# Patient Record
Sex: Male | Born: 1990 | Race: White | Hispanic: No | Smoking: Never smoker
Health system: Southern US, Community
[De-identification: ages and names within clinical notes are randomized; demographics above are authoritative.]

---

## 2014-06-02 ENCOUNTER — Encounter (INDEPENDENT_AMBULATORY_CARE_PROVIDER_SITE_OTHER): Payer: BLUE CROSS/BLUE SHIELD | Admitting: Family Medicine

## 2014-06-02 ENCOUNTER — Encounter: Payer: Self-pay | Admitting: Family Medicine

## 2014-06-17 NOTE — Progress Notes (Signed)
This encounter was created in error - please disregard.

## 2014-06-17 NOTE — Progress Notes (Deleted)
   Subjective:    Patient ID: Jeffrey RighterJonathan Bokhari, male    DOB: 04-16-1990, 24 y.o.   MRN: [id number] This chart was scribed for Norberto SorensonEva Shaw, MD by Littie Deedsichard Sun, Medical Scribe. This patient was seen in Room 28 and the patient's care was started at 11:46 PM.   Diarrhea    HPI Comments: Jeffrey Russell is a 24 y.o. male who presents to the [facility] complaining of ***    Review of Systems  Gastrointestinal: Positive for diarrhea.       Objective:  BP 125/80 mmHg  Pulse 102  Temp(Src) 99.1 F (37.3 C) (Oral)  Resp 16  Ht 5\' 11"  (1.803 m)  Wt 134 lb 9.6 oz (61.054 kg)  BMI 18.78 kg/m2  SpO2 100%  Physical Exam  Constitutional: He is oriented to person, place, and time. He appears well-developed and well-nourished. No distress.  HENT:  Head: Normocephalic and atraumatic.  Mouth/Throat: Oropharynx is clear and moist. No oropharyngeal exudate.  Eyes: Pupils are equal, round, and reactive to light.  Neck: Neck supple.  Cardiovascular: Normal rate.   Pulmonary/Chest: Effort normal.  Musculoskeletal: He exhibits no edema.  Neurological: He is alert and oriented to person, place, and time. No cranial nerve deficit.  Skin: Skin is warm and dry. No rash noted.  Psychiatric: He has a normal mood and affect. His behavior is normal.  Nursing note and vitals reviewed.         Assessment & Plan:    *** {Add scribe attestation statement}

## 2014-06-23 ENCOUNTER — Ambulatory Visit (INDEPENDENT_AMBULATORY_CARE_PROVIDER_SITE_OTHER): Payer: BLUE CROSS/BLUE SHIELD

## 2014-06-23 ENCOUNTER — Ambulatory Visit (INDEPENDENT_AMBULATORY_CARE_PROVIDER_SITE_OTHER): Payer: BLUE CROSS/BLUE SHIELD | Admitting: Family Medicine

## 2014-06-23 ENCOUNTER — Encounter: Payer: Self-pay | Admitting: Family Medicine

## 2014-06-23 VITALS — BP 100/64 | HR 81 | Temp 98.6°F | Resp 16 | Ht 71.5 in | Wt 135.2 lb

## 2014-06-23 DIAGNOSIS — K219 Gastro-esophageal reflux disease without esophagitis: Secondary | ICD-10-CM | POA: Diagnosis not present

## 2014-06-23 DIAGNOSIS — H6981 Other specified disorders of Eustachian tube, right ear: Secondary | ICD-10-CM | POA: Diagnosis not present

## 2014-06-23 DIAGNOSIS — R059 Cough, unspecified: Secondary | ICD-10-CM

## 2014-06-23 DIAGNOSIS — B3781 Candidal esophagitis: Secondary | ICD-10-CM | POA: Diagnosis not present

## 2014-06-23 DIAGNOSIS — Z0000 Encounter for general adult medical examination without abnormal findings: Secondary | ICD-10-CM | POA: Diagnosis not present

## 2014-06-23 DIAGNOSIS — Z113 Encounter for screening for infections with a predominantly sexual mode of transmission: Secondary | ICD-10-CM

## 2014-06-23 DIAGNOSIS — B370 Candidal stomatitis: Secondary | ICD-10-CM | POA: Diagnosis not present

## 2014-06-23 DIAGNOSIS — R05 Cough: Secondary | ICD-10-CM | POA: Diagnosis not present

## 2014-06-23 DIAGNOSIS — R410 Disorientation, unspecified: Secondary | ICD-10-CM | POA: Diagnosis not present

## 2014-06-23 DIAGNOSIS — E559 Vitamin D deficiency, unspecified: Secondary | ICD-10-CM

## 2014-06-23 LAB — POCT URINALYSIS DIPSTICK
BILIRUBIN UA: NEGATIVE
Blood, UA: NEGATIVE
Glucose, UA: NEGATIVE
KETONES UA: NEGATIVE
Leukocytes, UA: NEGATIVE
Nitrite, UA: NEGATIVE
PH UA: 7
PROTEIN UA: NEGATIVE
SPEC GRAV UA: 1.015
Urobilinogen, UA: 0.2

## 2014-06-23 LAB — POCT UA - MICROSCOPIC ONLY
CASTS, UR, LPF, POC: NEGATIVE
Crystals, Ur, HPF, POC: NEGATIVE
Mucus, UA: POSITIVE
WBC, UR, HPF, POC: NEGATIVE
YEAST UA: NEGATIVE

## 2014-06-23 LAB — COMPLETE METABOLIC PANEL WITH GFR
ALBUMIN: 4.9 g/dL (ref 3.5–5.2)
ALT: 13 U/L (ref 0–53)
AST: 18 U/L (ref 0–37)
Alkaline Phosphatase: 49 U/L (ref 39–117)
BUN: 14 mg/dL (ref 6–23)
CALCIUM: 9.8 mg/dL (ref 8.4–10.5)
CO2: 27 mEq/L (ref 19–32)
CREATININE: 0.86 mg/dL (ref 0.50–1.35)
Chloride: 100 mEq/L (ref 96–112)
Glucose, Bld: 87 mg/dL (ref 70–99)
POTASSIUM: 4 meq/L (ref 3.5–5.3)
Sodium: 139 mEq/L (ref 135–145)
Total Bilirubin: 0.7 mg/dL (ref 0.2–1.2)
Total Protein: 7.7 g/dL (ref 6.0–8.3)

## 2014-06-23 LAB — TSH: TSH: 1.214 u[IU]/mL (ref 0.350–4.500)

## 2014-06-23 LAB — LIPID PANEL
CHOL/HDL RATIO: 2.9 ratio
Cholesterol: 140 mg/dL (ref 0–200)
HDL: 49 mg/dL (ref >=40)
LDL Cholesterol: 82 mg/dL (ref 0–99)
TRIGLYCERIDES: 45 mg/dL (ref <150)
VLDL: 9 mg/dL (ref 0–40)

## 2014-06-23 LAB — C-REACTIVE PROTEIN: CRP: <0.5 mg/dL (ref <0.60)

## 2014-06-23 LAB — VITAMIN B12: Vitamin B-12: 1001 pg/mL — ABNORMAL HIGH (ref 211–911)

## 2014-06-23 MED ORDER — FLUTICASONE PROPIONATE 50 MCG/ACT NA SUSP: 2.0000 | Freq: Every day | NASAL | Status: DC

## 2014-06-23 MED ORDER — CETIRIZINE HCL 10 MG PO TABS: 10.0000 mg | ORAL_TABLET | Freq: Every day | ORAL | Status: DC

## 2014-06-23 MED ORDER — PSEUDOEPHEDRINE HCL ER 120 MG PO TB12: 120.0000 mg | ORAL_TABLET | Freq: Every day | ORAL | Status: DC

## 2014-06-23 MED ORDER — ALBUTEROL SULFATE (2.5 MG/3ML) 0.083% IN NEBU
2.5000 mg | INHALATION_SOLUTION | Freq: Once | RESPIRATORY_TRACT | Status: AC
  Administered 2014-06-23: 2.5 mg via RESPIRATORY_TRACT

## 2014-06-23 MED ORDER — FLUCONAZOLE 100 MG PO TABS: 100.0000 mg | ORAL_TABLET | Freq: Every day | ORAL | Status: DC

## 2014-06-23 MED ORDER — RANITIDINE HCL 150 MG PO CAPS: 150.0000 mg | ORAL_CAPSULE | Freq: Two times a day (BID) | ORAL | Status: DC

## 2014-06-23 NOTE — Patient Instructions (Addendum)
Attention Deficit Hyperactivity Disorder Attention deficit hyperactivity disorder (ADHD) is a problem with behavior issues based on the way the brain functions (neurobehavioral disorder). It is a common reason for behavior and academic problems in school. SYMPTOMS  There are 3 types of ADHD. The 3 types and some of the symptoms include:  Inattentive.  Gets bored or distracted easily.  Loses or forgets things. Forgets to hand in homework.  Has trouble organizing or completing tasks.  Difficulty staying on task.  An inability to organize daily tasks and school work.  Leaving projects, chores, or homework unfinished.  Trouble paying attention or responding to details. Careless mistakes.  Difficulty following directions. Often seems like is not listening.  Dislikes activities that require sustained attention (like chores or homework).  Hyperactive-impulsive.  Feels like it is impossible to sit still or stay in a seat. Fidgeting with hands and feet.  Trouble waiting turn.  Talking too much or out of turn. Interruptive.  Speaks or acts impulsively.  Aggressive, disruptive behavior.  Constantly busy or on the go; noisy.  Often leaves seat when they are expected to remain seated.  Often runs or climbs where it is not appropriate, or feels very restless.  Combined.  Has symptoms of both of the above. Often children with ADHD feel discouraged about themselves and with school. They often perform well below their abilities in school. As children get older, the excess motor activities can calm down, but the problems with paying attention and staying organized persist. Most children do not outgrow ADHD but with good treatment can learn to cope with the symptoms. DIAGNOSIS  When ADHD is suspected, the diagnosis should be made by professionals trained in ADHD. This professional will collect information about the individual suspected of having ADHD. Information must be collected from  various settings where the person lives, works, or attends school.  Diagnosis will include: 1. Confirming symptoms began in childhood. 2. Ruling out other reasons for the child's behavior. 3. The health care providers will check with the child's school and check their medical records. 4. They will talk to teachers and parents. 5. Behavior rating scales for the child will be filled out by those dealing with the child on a daily basis. A diagnosis is made only after all information has been considered. TREATMENT  Treatment usually includes behavioral treatment, tutoring or extra support in school, and stimulant medicines. Because of the way a person's brain works with ADHD, these medicines decrease impulsivity and hyperactivity and increase attention. This is different than how they would work in a person who does not have ADHD. Other medicines used include antidepressants and certain blood pressure medicines. Most experts agree that treatment for ADHD should address all aspects of the person's functioning. Along with medicines, treatment should include structured classroom management at school. Parents should reward good behavior, provide constant discipline, and set limits. Tutoring should be available for the child as needed. ADHD is a lifelong condition. If untreated, the disorder can have long-term serious effects into adolescence and adulthood. HOME CARE INSTRUCTIONS   Often with ADHD there is a lot of frustration among family members dealing with the condition. Blame and anger are also feelings that are common. In many cases, because the problem affects the family as a whole, the entire family may need help. A therapist can help the family find better ways to handle the disruptive behaviors of the person with ADHD and promote change. If the person with ADHD is young, most of the therapist's  work is with the parents. Parents will learn techniques for coping with and improving their child's  behavior. Sometimes only the child with the ADHD needs counseling. Your health care providers can help you make these decisions.  Children with ADHD may need help learning how to organize. Some helpful tips include:  Keep routines the same every day from wake-up time to bedtime. Schedule all activities, including homework and playtime. Keep the schedule in a place where the person with ADHD will often see it. Mark schedule changes as far in advance as possible.  Schedule outdoor and indoor recreation.  Have a place for everything and keep everything in its place. This includes clothing, backpacks, and school supplies.  Encourage writing down assignments and bringing home needed books. Work with your child's teachers for assistance in organizing school work.  Offer your child a well-balanced diet. Breakfast that includes a balance of whole grains, protein, and fruits or vegetables is especially important for school performance. Children should avoid drinks with caffeine including:  Soft drinks.  Coffee.  Tea.  However, some older children (adolescents) may find these drinks helpful in improving their attention. Because it can also be common for adolescents with ADHD to become addicted to caffeine, talk with your health care provider about what is a safe amount of caffeine intake for your child.  Children with ADHD need consistent rules that they can understand and follow. If rules are followed, give small rewards. Children with ADHD often receive, and expect, criticism. Look for good behavior and praise it. Set realistic goals. Give clear instructions. Look for activities that can foster success and self-esteem. Make time for pleasant activities with your child. Give lots of affection.  Parents are their children's greatest advocates. Learn as much as possible about ADHD. This helps you become a stronger and better advocate for your child. It also helps you educate your child's teachers and  instructors if they feel inadequate in these areas. Parent support groups are often helpful. A national group with local chapters is called Children and Adults with Attention Deficit Hyperactivity Disorder (CHADD). SEEK MEDICAL CARE IF: 1. Your child has repeated muscle twitches, cough, or speech outbursts. 2. Your child has sleep problems. 3. Your child has a marked loss of appetite. 4. Your child develops depression. 5. Your child has new or worsening behavioral problems. 6. Your child develops dizziness. 7. Your child has a racing heart. 8. Your child has stomach pains. 9. Your child develops headaches. SEEK IMMEDIATE MEDICAL CARE IF:  Your child has been diagnosed with depression or anxiety and the symptoms seem to be getting worse.  Your child has been depressed and suddenly appears to have increased energy or motivation.  You are worried that your child is having a bad reaction to a medication he or she is taking for ADHD. Document Released: 02/21/2002 Document Revised: 03/08/2013 Document Reviewed: 11/08/2012 [facility] Patient Information 2015 [place], [place]. This information is not intended to replace advice given to you by your health care provider. Make sure you discuss any questions you have with your health care provider.   Raynaud's Syndrome Raynaud's Syndrome is a disorder of the blood vessels in your hands and feet. It occurs when small arteries of the arms/hands or legs/feet become sensitive to cold or emotional upset. This causes the arteries to constrict, or narrow, and reduces blood flow to the area. The color in the fingers or toes changes from white to bluish to red and this is not usually painful. There  may be numbness and tingling. Sores on the skin (ulcers) can form. Symptoms are usually relieved by warming. HOME CARE INSTRUCTIONS   Avoid exposure to cold. Keep your whole body warm and dry. Dress in layers. Wear mittens or gloves when handling ice or frozen food and  when outdoors. Use holders for glasses or cans containing cold drinks. If possible, stay indoors during cold weather.  Limit your use of caffeine. Switch to decaffeinated coffee, tea, and soda pop. Avoid chocolate.  Avoid smoking or being around cigarette smoke. Smoke will make symptoms worse.  Wear loose fitting socks and comfortable, roomy shoes.  Avoid vibrating tools and machinery.  If possible, avoid stressful and emotional situations. Exercise, meditation and yoga may help you cope with stress. Biofeedback may be useful.  Ask your caregiver about medicine (calcium channel blockers) that may control Raynaud's phenomena. SEEK MEDICAL CARE IF:  6. Your discomfort becomes worse, despite conservative treatment. 7. You develop sores on your fingers and toes that do not heal. Document Released: 02/29/2000 Document Revised: 05/26/2011 Document Reviewed: 03/07/2008 [facility] Patient Information 2015 [place], [place]. This information is not intended to replace advice given to you by your health care provider. Make sure you discuss any questions you have with your health care provider. Generalized Anxiety Disorder Generalized anxiety disorder (GAD) is a mental disorder. It interferes with life functions, including relationships, work, and school. GAD is different from normal anxiety, which everyone experiences at some point in their lives in response to specific life events and activities. Normal anxiety actually helps [place] prepare for and get through these life events and activities. Normal anxiety goes away after the event or activity is over.  GAD causes anxiety that is not necessarily related to specific events or activities. It also causes excess anxiety in proportion to specific events or activities. The anxiety associated with GAD is also difficult to control. GAD can vary from mild to severe. People with severe GAD can have intense waves of anxiety with physical symptoms (panic attacks).   SYMPTOMS The anxiety and worry associated with GAD are difficult to control. This anxiety and worry are related to many life events and activities and also occur more days than not for 6 months or longer. People with GAD also have three or more of the following symptoms (one or more in children):  Restlessness.   Fatigue.  Difficulty concentrating.   Irritability.  Muscle tension.  Difficulty sleeping or unsatisfying sleep. DIAGNOSIS GAD is diagnosed through an assessment by your health care provider. Your health care provider will ask you questions aboutyour mood,physical symptoms, and events in your life. Your health care provider may ask you about your medical history and use of alcohol or drugs, including prescription medicines. Your health care provider may also do a physical exam and blood tests. Certain medical conditions and the use of certain substances can cause symptoms similar to those associated with GAD. Your health care provider may refer you to a mental health specialist for further evaluation. TREATMENT The following therapies are usually used to treat GAD:  8. Medication. Antidepressant medication usually is prescribed for long-term daily control. Antianxiety medicines may be added in severe cases, especially when panic attacks occur.  9. Talk therapy (psychotherapy). Certain types of talk therapy can be helpful in treating GAD by providing support, education, and guidance. A form of talk therapy called cognitive behavioral therapy can teach you healthy ways to think about and react to daily life events and activities. 10. Stress managementtechniques. These  include yoga, meditation, and exercise and can be very helpful when they are practiced regularly. A mental health specialist can help determine which treatment is best for you. Some people see improvement with one therapy. However, other people require a combination of therapies. Document Released: 06/28/2012  Document Revised: 07/18/2013 Document Reviewed: 06/28/2012 [facility] Patient Information 2015 [place], [place]. This information is not intended to replace advice given to you by your health care provider. Make sure you discuss any questions you have with your health care provider. Memory Compensation Strategies  11. Use "WARM" strategy.  W= write it down  A= associate it  R= repeat it  M= make a mental note  2.   You can keep a Glass blower/designer.  Use a 3-ring notebook with sections for the following: calendar, important names and phone numbers,  medications, doctors' names/phone numbers, lists/reminders, and a section to journal what you did  each day.   3.    Use a calendar to write appointments down.  4.    Write yourself a schedule for the day.  This can be placed on the calendar or in a separate section of the Memory Notebook.  Keeping a  regular schedule can help memory.  5.    Use medication organizer with sections for each day or morning/evening pills.  You may need help loading it  6.    Keep a basket, or pegboard by the door.  Place items that you need to take out with you in the basket or on the pegboard.  You may also want to  include a message board for reminders.  7.    Use sticky notes.  Place sticky notes with reminders in a place where the task is performed.  For example: " turn off the  stove" placed by the stove, "lock the door" placed on the door at eye level, " take your medications" on  the bathroom mirror or by the place where you normally take your medications.  8.    Use alarms/timers.  Use while cooking to remind yourself to check on food or as a reminder to take your medicine, or as a  reminder to make a call, or as a reminder to perform another task, etc. Management of Memory Problems  There are some general things you can do to help manage your memory problems.  Your memory may not in fact recover, but by using techniques and strategies you will be able to  manage your memory difficulties better.  1)  Establish a routine.  Try to establish and then stick to a regular routine.  By doing this, you will get used to what to expect and you will reduce the need to rely on your memory.  Also, try to do things at the same time of day, such as taking your medication or checking your calendar first thing in the morning.  Think about think that you can do as a part of a regular routine and make a list.  Then enter them into a daily planner to remind you.  This will help you establish a routine.  2)  Organize your environment.  Organize your environment so that it is uncluttered.  Decrease visual stimulation.  Place everyday items such as keys or cell phone in the same place every day (ie.  Basket next to front door)  Use post it notes with a brief message to yourself (ie. Turn off light, lock the door)  Use labels to indicate where things go (  ie. Which cupboards are for food, dishes, etc.)  Keep a notepad and pen by the telephone to take messages  3)  Memory Aids  A diary or journal/notebook/daily planner  Making a list (shopping list, chore list, to do list that needs to be done)  Using an alarm as a reminder (kitchen timer or cell phone alarm)  Using cell phone to store information (Notes, Calendar, Reminders)  Calendar/White board placed in a prominent position  Post-it notes  In order for memory aids to be useful, you need to have good habits.  It's no good remembering to make a note in your journal if you don't remember to look in it.  Try setting aside a certain time of day to look in journal.  4)  Improving mood and managing fatigue.  There may be other factors that contribute to memory difficulties.  Factors, such as anxiety, depression and tiredness can affect memory.  Regular gentle exercise can help improve your mood and give you more energy.  Simple relaxation techniques may help relieve symptoms of anxiety  Try to get back to  completing activities or hobbies you enjoyed doing in the past.  Learn to pace yourself through activities to decrease fatigue.  Find out about some local support groups where you can share experiences with others.  Try and achieve 7-8 hours of sleep at night.

## 2014-06-23 NOTE — Progress Notes (Addendum)
Subjective:    Patient ID: Jeffrey Russell, male    DOB: Apr 10, 1990, 24 y.o.   MRN: [id number] This chart was scribed for Jeffrey Sorenson, MD by Littie Deeds, Medical Scribe. This patient was seen in Room 25 and the patient's care was started at 11:25 AM.   Chief Complaint  Patient presents with  . Annual Exam    HPI HPI Comments: Jeffrey Russell is a 24 y.o. male who presents to the [facility] for a complete physical exam.   General Health/Health Maintenance: He had his tetanus shot last year.  He has FMHx of cancer (possibly pancreatic) in his grandfather; he passed away in his 74s. He denies smoking and drug use. He does have 2 drinks of EtOH a week. Patient does report exercising a few times a week at home, doing exercises such as chin-ups, push-ups and lunges.   Watery Eyes/Rhinorrhea: Patient reports having an ongoing problem with watery eyes and rhinorrhea for the past few years. He has these symptoms throughout the year and is not limited to allergy season. The rhinorrhea is worse when the weather is cold. He states his mother and her grandfather also have hx of similar issues. He denies HA (he states he does not often get HA). Patient does not take any regular medications, including allergy medications. He has been taking vitamin B12 intermittently for the past year.  URI: Patient reports having cough and congestion due to an illness last week. His symptoms have improved since then, but he still has some lingering cough and congestion. He has not taken any OTC medications for this.  SOB: Patient reports having some occasional SOB when active. He denies wheezing.  Diarrhea: He also reports having frequent diarrhea, having about 4-5 episodes a week and sometimes 3-4 times a day. This is described as loose stools, not watery stools. He does report having abdominal pain before episodes. He does report having occasional heartburn and indigest, but not very often. He does not  take any OTC medications for this. Patient denies blood in stool or blood when wiping (although he has noticed this once).  Decreased Concentration/Memory: Patient reports having an ongoing problem with decreased concentration described as difficulty remembering things. He states the problem has been getting worse and his family members have noticed. For example, he states he has some difficulty remembering things that people tell him and he also does not remember what year he graduated, despite being a few years ago. To help him remember things, he sets up automatic reminders on his phone. He denies getting lost or keeping track of money. He notes that he has FMHx of similar concentration/memory problems on his mother's side. Patient also notes that he had difficulty focusing when he was in school. He denies getting into trouble much while at school, depression and anxiety.   Icy Fingers: He notes that his fingers will sometimes turn cold/icy and blue. Sometimes, he will only experience this in a single finger. He also reports having occasional mild arthralgias, dry mouth, and decreased saliva. Patient denies rash, joint swelling, and changes in skin or nails. He is unsure whether this runs in the family.   Lymph Node: Patient also complains of a swollen lymph node to the right side of his chest that has been there for several years. He notes that the area is painful to the touch.  Patient is fasting. 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  History reviewed. No pertinent past medical history. History reviewed. No pertinent past surgical history. History reviewed. No pertinent family history.  History   Social History  . Marital Status: Significant Other    Spouse Name: N/A  . Number of Children: N/A  . Years of Education: N/A   Occupational History  . Auditor    Social History Main Topics  . Smoking status: Never Smoker   . Smokeless  tobacco: Not on file  . Alcohol Use: 1.2 oz/week    2 Standard drinks or equivalent per week  . Drug Use: No  . Sexual Activity: Not on file   Other Topics Concern  . Not on file   Social History Narrative   Single. Education: [employer].   No current outpatient prescriptions on file prior to visit.   No current facility-administered medications on file prior to visit.   No Known Allergies   Review of Systems ROS reviewed per patient health survey.     Objective:  BP 100/64 mmHg  Pulse 81  Temp(Src) 98.6 F (37 C) (Oral)  Resp 16  Ht 5' 11.5" (1.816 m)  Wt 135 lb 3.2 oz (61.326 kg)  BMI 18.60 kg/m2  SpO2 99%  Physical Exam  Constitutional: He is oriented to person, place, and time. He appears well-developed and well-nourished. No distress.  HENT:  Head: Normocephalic and atraumatic.  Right Ear: Tympanic membrane is erythematous and retracted.  Left Ear: Tympanic membrane normal.  Mouth/Throat: Oropharynx is clear and moist. No oropharyngeal exudate.  Eyes: Pupils are equal, round, and reactive to light.  Neck: Neck supple. No thyromegaly present.  Thyroid normal. No lymphadenopathy.  Cardiovascular: Regular rhythm, S1 normal and S2 normal.  Tachycardia present.   No murmur heard. Pulmonary/Chest: Effort normal.  Good air movement. CTAB except right lower lobe, with inspiratory and expiratory rhonchi.  Abdominal: Bowel sounds are normal.  Musculoskeletal: He exhibits no edema.  Lymphadenopathy:    He has no cervical adenopathy.  Neurological: He is alert and oriented to person, place, and time. No cranial nerve deficit.  Skin: Skin is warm and dry.  2 mm firm, well-defined, mobile nodule. No fluctuance. No induration. Not adhering to the skin or bony ribs. Moderately TTP. No warmth or overlying skin changes.  Psychiatric: He has a normal mood and affect. His behavior is normal.  Vitals reviewed.     [facility] reading (PRIMARY) by  Dr. Clelia CroftShaw. CXR: no acute abnormality,  no infiltrate, aorta appears normal   EKG: NSR, no ischemic changes Assessment & Plan:   Routine general medical examination at a health care facility - Plan: POCT UA - Microscopic Only, POCT urinalysis dipstick, Lipid panel, COMPLETE METABOLIC PANEL WITH GFR, C-reactive protein, TSH, Vitamin B12, Vit D  25 hydroxy (rtn osteoporosis monitoring), ANA  Cough - Plan: POCT CBC, albuterol (PROVENTIL) (2.5 MG/3ML) 0.083% nebulizer solution 2.5 mg, DG Chest 2 View  Confusion - Plan: EKG 12-Lead  Screening for STD (sexually transmitted disease) - Plan: Hepatitis C antibody, RPR, HIV antibody, GC probe amplification, urine  Eustachian tube dysfunction, right  Meds ordered this encounter  Medications  . albuterol (PROVENTIL) (2.5 MG/3ML) 0.083% nebulizer solution 2.5 mg    Sig:   . pseudoephedrine (SUDAFED 12 HOUR) 120 MG 12 hr tablet    Sig: Take 1 tablet (120 mg total) by mouth daily.    Dispense:  30 tablet    Refill:  0  . fluticasone (FLONASE) 50 MCG/ACT nasal spray  Sig: Place 2 sprays into both nostrils at bedtime.    Dispense:  16 g    Refill:  2  . cetirizine (ZYRTEC) 10 MG tablet    Sig: Take 1 tablet (10 mg total) by mouth at bedtime.    Dispense:  30 tablet    Refill:  11    I personally performed the services described in this documentation, which was scribed in my presence. The recorded information has been reviewed and considered, and addended by me as needed.  Jeffrey Sorenson, MD MPH

## 2014-06-24 ENCOUNTER — Telehealth: Payer: Self-pay

## 2014-06-24 LAB — GC PROBE AMPLIFICATION, URINE: GC Probe Amp, Urine: NEGATIVE

## 2014-06-24 LAB — RPR

## 2014-06-24 LAB — HEPATITIS C ANTIBODY: HCV Ab: NEGATIVE

## 2014-06-24 LAB — VITAMIN D 25 HYDROXY (VIT D DEFICIENCY, FRACTURES): Vit D, 25-Hydroxy: 22 ng/mL — ABNORMAL LOW (ref 30–100)

## 2014-06-24 LAB — HIV ANTIBODY (ROUTINE TESTING W REFLEX): HIV: NONREACTIVE

## 2014-06-24 NOTE — Telephone Encounter (Signed)
Pt  Has questions about the dosage of the medication he was given by dr Clelia Croftshaw yesterday

## 2014-06-26 LAB — ANA: Anti Nuclear Antibody(ANA): NEGATIVE

## 2014-06-26 NOTE — Telephone Encounter (Signed)
lmom to cb. 

## 2014-06-27 NOTE — Telephone Encounter (Signed)
Left message for pt to call CB if he still needs our help.

## 2014-07-10 DIAGNOSIS — E559 Vitamin D deficiency, unspecified: Secondary | ICD-10-CM | POA: Insufficient documentation

## 2014-07-10 DIAGNOSIS — K219 Gastro-esophageal reflux disease without esophagitis: Secondary | ICD-10-CM | POA: Insufficient documentation

## 2014-07-10 MED ORDER — ERGOCALCIFEROL 1.25 MG (50000 UT) PO CAPS: 50000.0000 [IU] | ORAL_CAPSULE | ORAL | Status: DC

## 2014-07-10 NOTE — Addendum Note (Signed)
Addended by: Norberto SorensonSHAW, Chavie Kolinski on: 07/10/2014 12:46 AM   Modules accepted: Kipp BroodSmartSet

## 2014-07-10 NOTE — Addendum Note (Signed)
Addended by: Norberto SorensonSHAW, EVA on: 07/10/2014 12:44 AM   Modules accepted: Orders, SmartSet

## 2014-08-04 ENCOUNTER — Ambulatory Visit (INDEPENDENT_AMBULATORY_CARE_PROVIDER_SITE_OTHER): Payer: BLUE CROSS/BLUE SHIELD | Admitting: Family Medicine

## 2014-08-04 VITALS — BP 100/68 | HR 96 | Temp 98.2°F | Resp 16 | Ht 71.0 in | Wt 131.4 lb

## 2014-08-04 DIAGNOSIS — E559 Vitamin D deficiency, unspecified: Secondary | ICD-10-CM

## 2014-08-04 DIAGNOSIS — L709 Acne, unspecified: Secondary | ICD-10-CM

## 2014-08-04 DIAGNOSIS — B3749 Other urogenital candidiasis: Secondary | ICD-10-CM | POA: Diagnosis not present

## 2014-08-04 DIAGNOSIS — L700 Acne vulgaris: Secondary | ICD-10-CM

## 2014-08-04 DIAGNOSIS — N4889 Other specified disorders of penis: Secondary | ICD-10-CM | POA: Diagnosis not present

## 2014-08-04 MED ORDER — CHLORHEXIDINE GLUCONATE 4 % EX LIQD: Freq: Every day | CUTANEOUS | Status: DC | PRN

## 2014-08-04 MED ORDER — NYSTATIN 100000 UNIT/GM EX CREA: 1.0000 "application " | TOPICAL_CREAM | Freq: Two times a day (BID) | CUTANEOUS | Status: DC

## 2014-08-04 NOTE — Patient Instructions (Signed)
Cedaphil, aquaphor, eucerin all make good lotions - you want something thick in a tub, apply it immediately after showering and then dry very well in the air or hairdryer before socks and other occlusive clothing.  If you keep on having any inflammed nodular areas on your skin please call -  or note any flairs of prior problems, don't hesitate to let me know.  Acne Acne is a skin problem that causes pimples. Acne occurs when the pores in your skin get blocked. Your pores may become red, sore, and swollen (inflamed), or infected with a common skin bacterium (Propionibacterium acnes). Acne is a common skin problem. Up to 80% of people get acne at some time. Acne is especially common from the ages of 2112 to 224. Acne usually goes away over time with proper treatment. CAUSES  Your pores each contain an oil gland. The oil glands make an oily substance called sebum. Acne happens when these glands get plugged with sebum, dead skin cells, and dirt. The P. acnes bacteria that are normally found in the oil glands then multiply, causing inflammation. Acne is commonly triggered by changes in your hormones. These hormonal changes can cause the oil glands to get bigger and to make more sebum. Factors that can make acne worse include:  Hormone changes during adolescence.  Hormone changes during women's menstrual cycles.  Hormone changes during pregnancy.  Oil-based cosmetics and hair products.  Harshly scrubbing the skin.  Strong soaps.  Stress.  Hormone problems due to certain diseases.  Long or oily hair rubbing against the skin.  Certain medicines.  Pressure from headbands, backpacks, or shoulder pads.  Exposure to certain oils and chemicals. SYMPTOMS  Acne often occurs on the face, neck, chest, and upper back. Symptoms include:  Small, red bumps (pimples or papules).  Whiteheads (closed comedones).  Blackheads (open comedones).  Small, pus-filled pimples (pustules).  Big, red pimples or  pustules that feel tender. More severe acne can cause:  An infected area that contains a collection of pus (abscess).  Hard, painful, fluid-filled sacs (cysts).  Scars. DIAGNOSIS  Your caregiver can usually tell what the problem is by doing a physical exam. TREATMENT  There are many good treatments for acne. Some are available over the counter and some are available with a prescription. The treatment that is best for you depends on the type of acne you have and how severe it is. It may take 2 months of treatment before your acne gets better. Common treatments include:  Creams and lotions that prevent oil glands from clogging.  Creams and lotions that treat or prevent infections and inflammation.  Antibiotics applied to the skin or taken as a pill.  Pills that decrease sebum production.  Birth control pills.  Light or laser treatments.  Minor surgery.  Injections of medicine into the affected areas.  Chemicals that cause peeling of the skin. HOME CARE INSTRUCTIONS  Good skin care is the most important part of treatment.  Wash your skin gently at least twice a day and after exercise. Always wash your skin before bed.  Use mild soap.  After each wash, apply a water-based skin moisturizer.  Keep your hair clean and off of your face. Shampoo your hair daily.  Only take medicines as directed by your caregiver.  Use a sunscreen or sunblock with SPF 30 or greater. This is especially important when you are using acne medicines.  Choose cosmetics that are noncomedogenic. This means they do not plug the oil glands.  Avoid leaning your chin or forehead on your hands.  Avoid wearing tight headbands or hats.  Avoid picking or squeezing your pimples. This can make your acne worse and cause scarring. SEEK MEDICAL CARE IF:   Your acne is not better after 8 weeks.  Your acne gets worse.  You have a large area of skin that is red or tender. Document Released: 02/29/2000  Document Revised: 07/18/2013 Document Reviewed: 12/20/2010 [facility] Patient Information 2015 [place], [place]. This information is not intended to replace advice given to you by your health care provider. Make sure you discuss any questions you have with your health care provider.

## 2014-08-04 NOTE — Progress Notes (Signed)
Subjective:    Patient ID: Jeffrey Russell, male    DOB: March 19, 1990, 24 y.o.   MRN: [id number]   Chief Complaint  Patient presents with  . Follow-up    medication and labwork    HPI Jeffrey Russell is a 24 y.o. male who came in 6 weeks ago with a wide variety of concerns. We elected to do a full lab review and have patient return to discuss results. Patient is here today to follow up on the lab results from his last visit.   He has been in [place] for the past mo with his job - staying in a hotel - just got home today - so as not been needing or taking any of the medications I rx'ed to him at his last visit. Feels like his memory loss/confusion is improving.  Has some spots on his penis he is concerned about and would like evaluated.    No past medical history on file.  Current Outpatient Prescriptions on File Prior to Visit  Medication Sig Dispense Refill  . ranitidine (ZANTAC) 150 MG capsule Take 1 capsule (150 mg total) by mouth 2 (two) times daily. 60 capsule 11  . cetirizine (ZYRTEC) 10 MG tablet Take 1 tablet (10 mg total) by mouth at bedtime. (Patient not taking: Reported on 08/04/2014) 30 tablet 11  . ergocalciferol (VITAMIN D2) 50000 UNITS capsule Take 1 capsule (50,000 Units total) by mouth once a week. (Patient not taking: Reported on 08/04/2014) 12 capsule 0  . fluconazole (DIFLUCAN) 100 MG tablet Take 1 tablet (100 mg total) by mouth daily. Take 2 tabs on day 1. Continue until symptoms gone for at least 2 weeks (Patient not taking: Reported on 08/04/2014) 30 tablet 1  . fluticasone (FLONASE) 50 MCG/ACT nasal spray Place 2 sprays into both nostrils at bedtime. (Patient not taking: Reported on 08/04/2014) 16 g 2   No current facility-administered medications on file prior to visit.   No Known Allergies   Results for orders placed or performed in visit on 06/23/14  Hepatitis C antibody  Result Value Ref Range   HCV Ab NEGATIVE NEGATIVE  RPR  Result Value Ref Range   RPR Ser Ql NON REAC NON REAC  HIV antibody  Result Value Ref Range   HIV 1&2 Ab, 4th Generation NONREACTIVE NONREACTIVE  Lipid panel  Result Value Ref Range   Cholesterol 140 0 - 200 mg/dL   Triglycerides 45 <409 mg/dL   HDL 49 >=81 mg/dL   Total CHOL/HDL Ratio 2.9 Ratio   VLDL 9 0 - 40 mg/dL   LDL Cholesterol 82 0 - 99 mg/dL  COMPLETE METABOLIC PANEL WITH GFR  Result Value Ref Range   Sodium 139 135 - 145 mEq/L   Potassium 4.0 3.5 - 5.3 mEq/L   Chloride 100 96 - 112 mEq/L   CO2 27 19 - 32 mEq/L   Glucose, Bld 87 70 - 99 mg/dL   BUN 14 6 - 23 mg/dL   Creat 1.91 4.78 - 2.95 mg/dL   Total Bilirubin 0.7 0.2 - 1.2 mg/dL   Alkaline Phosphatase 49 39 - 117 U/L   AST 18 0 - 37 U/L   ALT 13 0 - 53 U/L   Total Protein 7.7 6.0 - 8.3 g/dL   Albumin 4.9 3.5 - 5.2 g/dL   Calcium 9.8 8.4 - 62.1 mg/dL   GFR, Est African American >89 mL/min   GFR, Est Non African American >89 mL/min  C-reactive protein  Result Value Ref  Range   CRP <0.5 <0.60 mg/dL  TSH  Result Value Ref Range   TSH 1.214 0.350 - 4.500 uIU/mL  Vitamin B12  Result Value Ref Range   Vitamin B-12 1001 (H) 211 - 911 pg/mL  Vit D  25 hydroxy (rtn osteoporosis monitoring)  Result Value Ref Range   Vit D, 25-Hydroxy 22 (L) 30 - 100 ng/mL  ANA  Result Value Ref Range   Anit Nuclear Antibody(ANA) NEG NEGATIVE  GC probe amplification, urine  Result Value Ref Range   GC Probe Amp, Urine NEGATIVE NEGATIVE  POCT UA - Microscopic Only  Result Value Ref Range   WBC, Ur, HPF, POC neg    RBC, urine, microscopic 7-8    Bacteria, U Microscopic 1+    Mucus, UA pos    Epithelial cells, urine per micros 0-1    Crystals, Ur, HPF, POC neg    Casts, Ur, LPF, POC neg    Yeast, UA neg   POCT urinalysis dipstick  Result Value Ref Range   Color, UA yellow    Clarity, UA clear    Glucose, UA neg    Bilirubin, UA neg    Ketones, UA neg    Spec Grav, UA 1.015    Blood, UA neg    pH, UA 7.0    Protein, UA neg    Urobilinogen, UA  0.2    Nitrite, UA neg    Leukocytes, UA Negative       Review of Systems  Constitutional: Positive for fatigue. Negative for unexpected weight change.  HENT: Negative for congestion.   Respiratory: Negative for cough.   Gastrointestinal: Negative for vomiting and abdominal pain.  Genitourinary: Positive for penile swelling and genital sores. Negative for dysuria, urgency, frequency, hematuria, decreased urine volume, discharge, difficulty urinating and penile pain.  Musculoskeletal: Negative for joint swelling and arthralgias.  Skin: Positive for rash. Negative for wound.  Hematological: Negative for adenopathy. Does not bruise/bleed easily.  Psychiatric/Behavioral: Positive for dysphoric mood and decreased concentration. Negative for behavioral problems, confusion, sleep disturbance and agitation. The patient is nervous/anxious.        Objective:   Physical Exam  Constitutional: He is oriented to person, place, and time. He appears well-developed and well-nourished. No distress.  HENT:  Head: Normocephalic and atraumatic.  Eyes: Conjunctivae and EOM are normal.  Neck: Neck supple. No tracheal deviation present.  Cardiovascular: Normal rate.   Pulmonary/Chest: Effort normal. No respiratory distress.  Genitourinary: Penis normal. Circumcised. No phimosis, penile erythema or penile tenderness. No discharge found.  Few 1mm tiny skin-colored papules immed proximal to glans with very subtle erythematous streak to the left on shaft of penis.  Musculoskeletal: Normal range of motion.  Lymphadenopathy:    He has no cervical adenopathy.    He has no axillary adenopathy.       Right axillary: No pectoral and no lateral adenopathy present.  Neurological: He is alert and oriented to person, place, and time.  Skin: Skin is warm and dry.  Psychiatric: His behavior is normal. His mood appears anxious. His affect is blunt. He does not exhibit a depressed mood.  Nursing note and vitals  reviewed.  Filed Vitals:   08/04/14 1614  BP: 100/68  Pulse: 96  Temp: 98.2 F (36.8 C)  Resp: 16          Assessment & Plan:   1. Vitamin D deficiency - completed high dose course  2. Penile papules - pt reassured completely benign, no  concern for HSV or other  3. Yeast dermatitis of penis - very very mild - try top antifungal  4. Nodular acne vs furuncles    Meds ordered this encounter  Medications  . chlorhexidine (HIBICLENS) 4 % external liquid    Sig: Apply topically daily as needed. Use as body wash once day for 5 days    Dispense:  120 mL    Refill:  0  . nystatin cream (MYCOSTATIN)    Sig: Apply 1 application topically 2 (two) times daily.    Dispense:  30 g    Refill:  0    I personally performed the services described in this documentation, which was scribed in my presence. The recorded information has been reviewed and considered, and addended by me as needed.  Norberto SorensonEva Bronc Brosseau, MD MPH

## 2015-07-26 ENCOUNTER — Encounter: Payer: Self-pay | Admitting: Emergency Medicine

## 2015-07-26 ENCOUNTER — Emergency Department
Admission: EM | Admit: 2015-07-26 | Discharge: 2015-07-27 | Disposition: A | Discharge status: Home / Self Care | Payer: 59 | Attending: Emergency Medicine | Admitting: Emergency Medicine

## 2015-07-26 DIAGNOSIS — N451 Epididymitis: Secondary | ICD-10-CM | POA: Diagnosis not present

## 2015-07-26 DIAGNOSIS — N50812 Left testicular pain: Secondary | ICD-10-CM | POA: Diagnosis present

## 2015-07-26 DIAGNOSIS — R609 Edema, unspecified: Secondary | ICD-10-CM

## 2015-07-26 NOTE — ED Notes (Signed)
Patient ambulatory to triage with steady gait, without difficulty or distress noted; pt reports left sided testicular pain & swelling x ; denies hx of same; denies any accomp symptoms

## 2015-07-27 ENCOUNTER — Emergency Department: Payer: 59

## 2015-07-27 LAB — CHLAMYDIA/NGC RT PCR (ARMC ONLY)
Chlamydia Tr: NOT DETECTED
N gonorrhoeae: NOT DETECTED

## 2015-07-27 LAB — URINALYSIS COMPLETE WITH MICROSCOPIC (ARMC ONLY)
Bacteria, UA: NONE SEEN
Bilirubin Urine: NEGATIVE
Glucose, UA: NEGATIVE mg/dL
Hgb urine dipstick: NEGATIVE
Ketones, ur: NEGATIVE mg/dL
Leukocytes, UA: NEGATIVE
Nitrite: NEGATIVE
PH: 7 (ref 5.0–8.0)
Protein, ur: NEGATIVE mg/dL
RBC / HPF: NONE SEEN RBC/hpf (ref 0–5)
Specific Gravity, Urine: 1.019 (ref 1.005–1.030)

## 2015-07-27 MED ORDER — CIPROFLOXACIN HCL 500 MG PO TABS: 500.0000 mg | ORAL_TABLET | Freq: Two times a day (BID) | ORAL | Status: AC

## 2015-07-27 MED ORDER — NAPROXEN 500 MG PO TABS: 500.0000 mg | ORAL_TABLET | Freq: Two times a day (BID) | ORAL | Status: AC

## 2015-07-27 NOTE — ED Provider Notes (Signed)
[facility] Emergency Department Provider Note  ____________________________________________  Time seen: 12:15 AM  I have reviewed the triage vital signs and the nursing notes.   HISTORY  Chief Complaint Testicle Pain    HPI Jeffrey Russell is a 25 y.o. male who complains of left testicle pain that started 1 hour ago, constant. No aggravating or alleviating factors. No dysuria frequency urgency hematuria penile discharge, genital lesions. There had anything like this before. No abdominal pain nausea vomiting diarrhea. He is sexually active, does not use protection, is certain that he has not been exposed to an STI Pain is mild, aching. Nonradiating.  History reviewed. No pertinent past medical history.   Patient Active Problem List   Diagnosis Date Noted  . Vitamin D deficiency 07/10/2014  . Esophageal reflux 07/10/2014     History reviewed. No pertinent past surgical history.   No current outpatient prescriptions on file. None  Allergies Review of patient's allergies indicates no known allergies.   No family history on file.  Social History Social History  Substance Use Topics  . Smoking status: Never Smoker   . Smokeless tobacco: None  . Alcohol Use: 1.2 oz/week    2 Standard drinks or equivalent per week    Review of Systems  Constitutional:   No fever or chills.   ENT:   No sore throat. No rhinorrhea. Gastrointestinal:   Negative for abdominal pain, vomiting and diarrhea.  Genitourinary:   Negative for dysuria or difficulty urinating.Positive left testicle pain Musculoskeletal:   Negative for focal pain or swelling  10-point ROS otherwise negative.  ____________________________________________   PHYSICAL EXAM:  VITAL SIGNS: ED Triage Vitals  Enc Vitals Group     BP 07/26/15 2350 131/85 mmHg     Pulse Rate 07/26/15 2350 83     Resp 07/26/15 2350 20     Temp 07/26/15 2350 97.7 F (36.5 C)     Temp Source 07/26/15 2350  Oral     SpO2 07/26/15 2350 99 %     Weight --      Height 07/26/15 2350 6' (1.829 m)     Head Cir --      Peak Flow --      Pain Score 07/26/15 2350 4     Pain Loc --      Pain Edu? --      Excl. in GC? --     Vital signs reviewed, nursing assessments reviewed.   Constitutional:   Alert and oriented. Well appearing and in no distress. Eyes:   No scleral icterus. No conjunctival pallor. PERRL. EOMI.     Head:   Normocephalic and atraumatic.    Neck:    No SubQ emphysema. No meningismus. Hematological/Lymphatic/Immunilogical:   No cervical lymphadenopathy. Gastrointestinal:   Soft and nontender. Non distended. There is no CVA tenderness.  No rebound, rigidity, or guarding. Genitourinary:   Normal external genitalia, circumcised male. No penile discharge, no lesions or ulcerations. Symmetric descended testes bilaterally, no transverse lie, mild left testicular tenderness particularly at the superior most edge. No masses. No inguinal hernias, examined while standing. Musculoskeletal:   Nontender with normal range of motion in all extremities. No joint effusions.  No lower extremity tenderness.  No edema. Neurologic:   Normal speech and language.  CN 2-10 normal. Motor grossly intact. No gross focal neurologic deficits are appreciated.  Skin:    Skin is warm, dry and intact. No rash noted.  No petechiae, purpura, or bullae.  ____________________________________________  LABS (pertinent positives/negatives) (all labs ordered are listed, but only abnormal results are displayed) Labs Reviewed  URINALYSIS COMPLETEWITH MICROSCOPIC ([facility] ONLY)   ____________________________________________   EKG    ____________________________________________    RADIOLOGY  Ultrasound scrotum unremarkable, normal blood flow  ____________________________________________   PROCEDURES   ____________________________________________   INITIAL IMPRESSION / ASSESSMENT AND PLAN /  ED COURSE  Pertinent labs & imaging results that were available during my care of the patient were reviewed by me and considered in my medical decision making (see chart for details).  Patient presents with left testicle pain, some tenderness suggestive of epididymitis. Low suspicion for torsion. We'll get an ultrasound. Vital signs normal. Patient is confident that he does not have a sexually transmitted infection, we will check urine also well.  ----------------------------------------- 1:47 AM on 07/27/2015 -----------------------------------------  Well-appearing, stable. Pain slightly decreased and only mild at this time. Ultrasound unremarkable. Clinically this is consistent with an epididymitis. We'll treat with Cipro, GC chlamydia PCR pending. Follow-up with primary care. NSAIDs, supportive device.     ____________________________________________   FINAL CLINICAL IMPRESSION(S) / ED DIAGNOSES  Final diagnoses:  Swelling  Left testicle pain Acute left-sided epididymitis     Portions of this note were generated with dragon dictation software. Dictation errors may occur despite best attempts at proofreading.   Sharman CheekPhillip Kalina Morabito, MD 07/27/15 [phone]

## 2015-07-27 NOTE — Discharge Instructions (Signed)
Epididymitis °Epididymitis is swelling (inflammation) of the epididymis. The epididymis is a cord-like structure that is located along the top and back part of the testicle. It collects and stores sperm from the testicle. °This condition can also cause pain and swelling of the testicle and scrotum. Symptoms usually start suddenly (acute epididymitis). Sometimes epididymitis starts gradually and lasts for a while (chronic epididymitis). This type may be harder to treat. °CAUSES °In men 35 and younger, this condition is usually caused by a bacterial infection or sexually transmitted disease (STD), such as: °· Gonorrhea. °· Chlamydia.   °In men 35 and older who do not have anal sex, this condition is usually caused by bacteria from a blockage or abnormalities in the urinary system. These can result from: °· Having a tube placed into the bladder (urinary catheter). °· Having an enlarged or inflamed prostate gland. °· Having recent urinary tract surgery. °In men who have a condition that weakens the body's defense system (immune system), such as HIV, this condition can be caused by:  °· Other bacteria, including tuberculosis and syphilis. °· Viruses. °· Fungi. °Sometimes this condition occurs without infection. That may happen if urine flows backward into the epididymis after heavy lifting or straining. °RISK FACTORS °This condition is more likely to develop in men: °· Who have unprotected sex with more than one partner. °· Who have anal sex.   °· Who have recently had surgery.   °· Who have a urinary catheter. °· Who have urinary problems. °· Who have a suppressed immune system.   °SYMPTOMS  °This condition usually begins suddenly with chills, fever, and pain behind the scrotum and in the testicle. Other symptoms include:  °· Swelling of the scrotum, testicle, or both. °· Pain when ejaculating or urinating. °· Pain in the back or belly. °· Nausea. °· Itching and discharge from the penis. °· Frequent need to pass  urine. °· Redness and tenderness of the scrotum. °DIAGNOSIS °Your health care provider can diagnose this condition based on your symptoms and medical history. Your health care provider will also do a physical exam to ask about your symptoms and check your scrotum and testicle for swelling, pain, and redness. You may also have other tests, including:   °· Examination of discharge from the penis. °· Urine tests for infections, such as STDs.   °Your health care provider may test you for other STDs, including HIV.  °TREATMENT °Treatment for this condition depends on the cause. If your condition is caused by a bacterial infection, oral antibiotic medicine may be prescribed. If the bacterial infection has spread to your blood, you may need to receive IV antibiotics. Nonbacterial epididymitis is treated with home care that includes bed rest and elevation of the scrotum. °Surgery may be needed to treat: °· Bacterial epididymitis that causes pus to build up in the scrotum (abscess). °· Chronic epididymitis that has not responded to other treatments. °HOME CARE INSTRUCTIONS °Medicines  °· Take over-the-counter and prescription medicines only as told by your health care provider.   °· If you were prescribed an antibiotic medicine, take it as told by your health care provider. Do not stop taking the antibiotic even if your condition improves. °Sexual Activity  °· If your epididymitis was caused by an STD, avoid sexual activity until your treatment is complete. °· Inform your sexual partner or partners if you test positive for an STD. They may need to be treated. Do not engage in sexual activity with your partner or partners until their treatment is completed. °General Instructions  °· Return to your normal activities as told   by your health care provider. Ask your health care provider what activities are safe for you. °· Keep your scrotum elevated and supported while resting. Ask your health care provider if you should wear a  scrotal support, such as a jockstrap. Wear it as told by your health care provider. °· If directed, apply ice to the affected area:   °¨ Put ice in a plastic bag. °¨ Place a towel between your skin and the bag. °¨ Leave the ice on for 20 minutes, 2-3 times per day. °· Try taking a sitz bath to help with discomfort. This is a warm water bath that is taken while you are sitting down. The water should only come up to your hips and should cover your buttocks. Do this 3-4 times per day or as told by your health care provider. °· Keep all follow-up visits as told by your health care provider. This is important. °SEEK MEDICAL CARE IF:  °· You have a fever.   °· Your pain medicine is not helping.   °· Your pain is getting worse.   °· Your symptoms do not improve within three days. °  °This information is not intended to replace advice given to you by your health care provider. Make sure you discuss any questions you have with your health care provider. °  °Document Released: 02/29/2000 Document Revised: 11/22/2014 Document Reviewed: 07/19/2014 °[employer] Interactive Patient Education ©2016 [employer] ° °

## 2017-01-11 IMAGING — US US SCROTUM
1 series · 14 of 25 positions shown · non-contrast
Comparison: None.

CLINICAL DATA: 24-year-old male with left testicular pain

EXAM:
SCROTAL ULTRASOUND
DOPPLER ULTRASOUND OF THE TESTICLES
TECHNIQUE: Complete ultrasound examination of the testicles, epididymis, and
other scrotal structures was performed. Color and spectral Doppler
ultrasound were also utilized to evaluate blood flow to the
testicles.

[Series 1: us scrotum · 0.07mm/px · 14 of 44 slices shown]
[im 1/44]
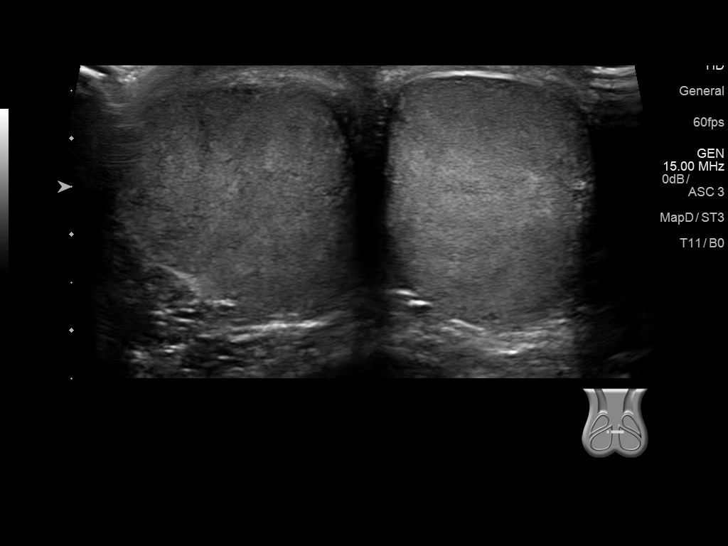
[im 4/44]
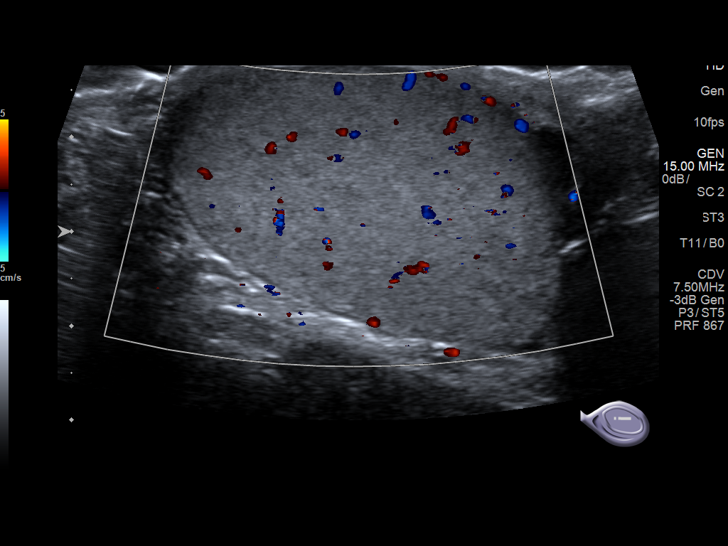
[im 8/44]
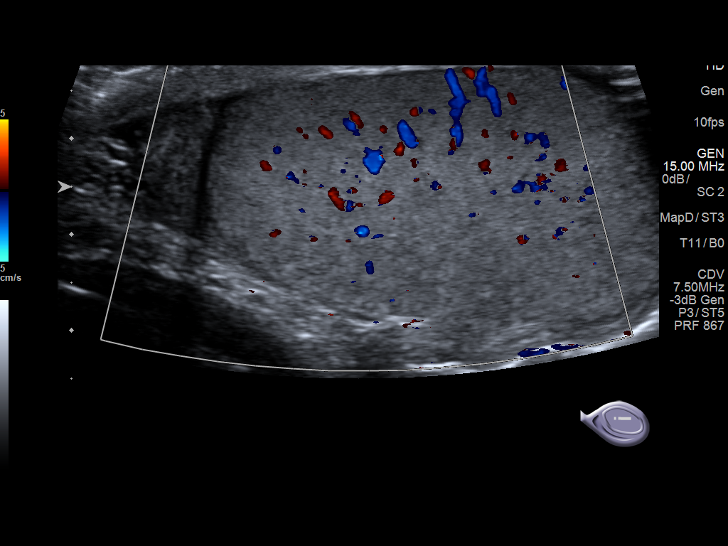
[im 11/44]
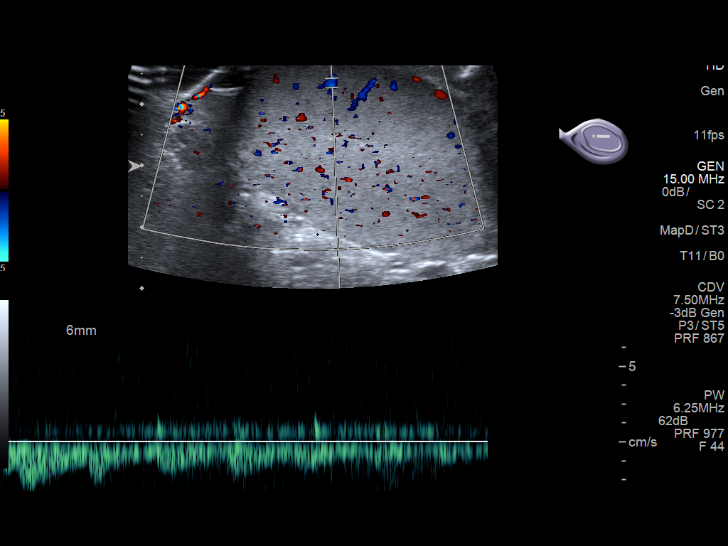
[im 15/44]
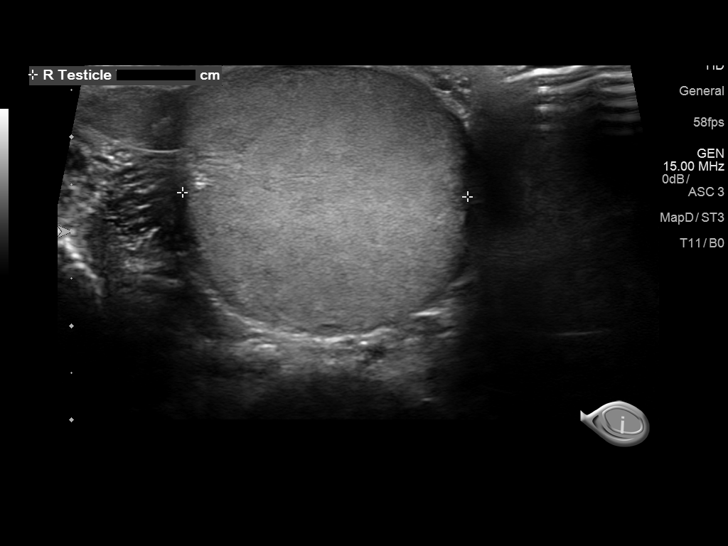
[im 17/44]
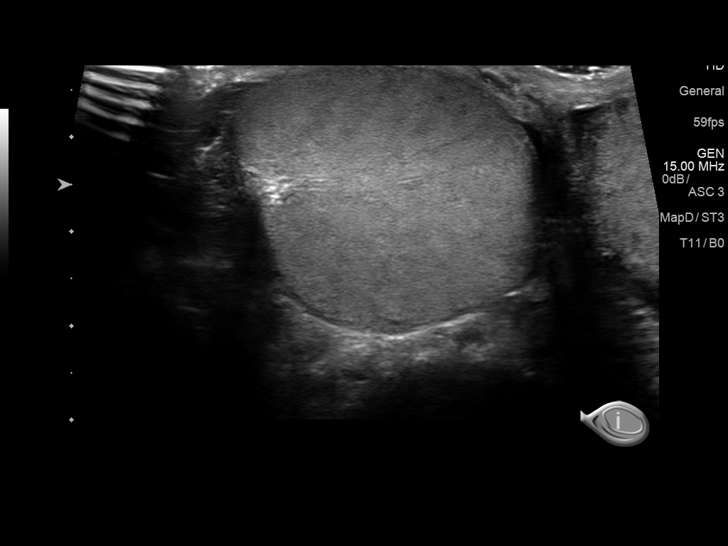
[im 20/44]
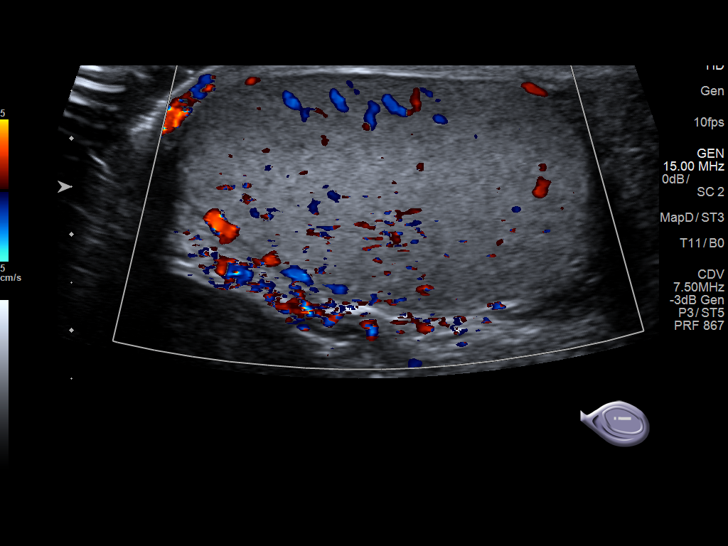
[im 24/44]
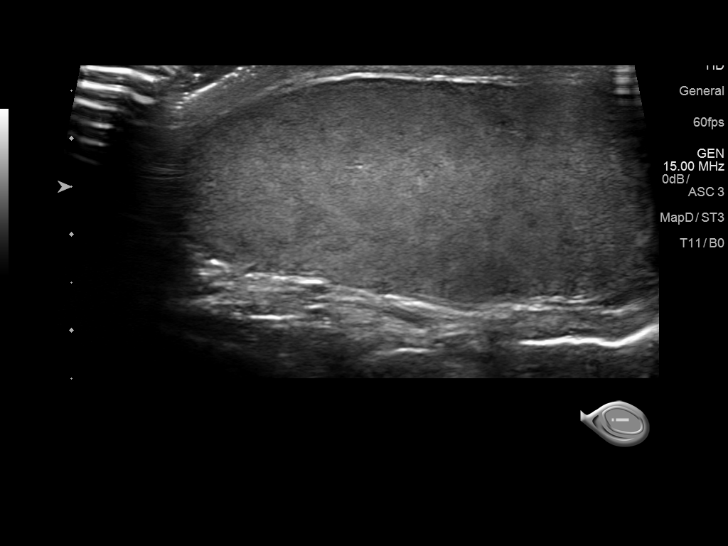
[im 27/44]
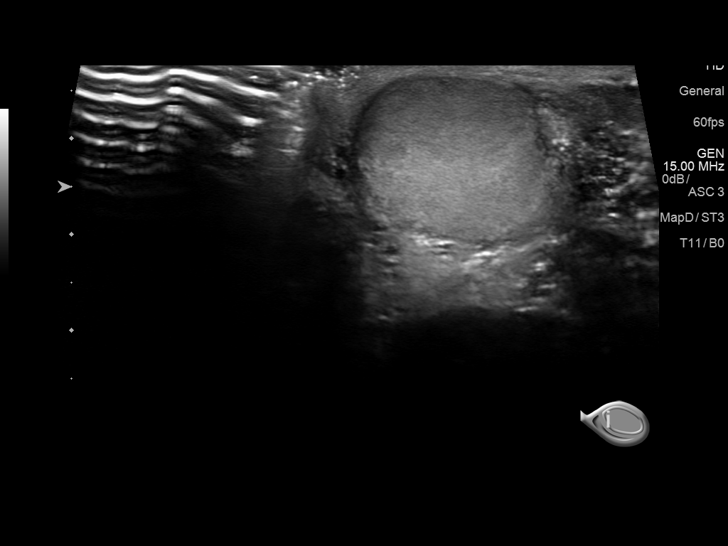
[im 29/44]
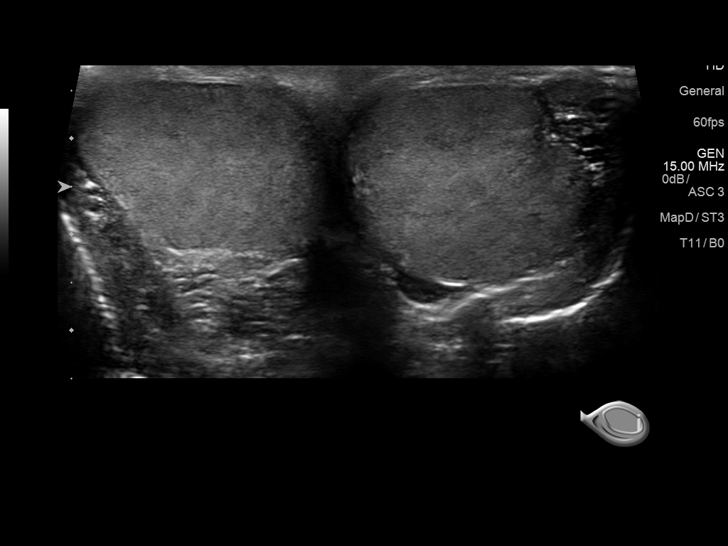
[im 33/44]
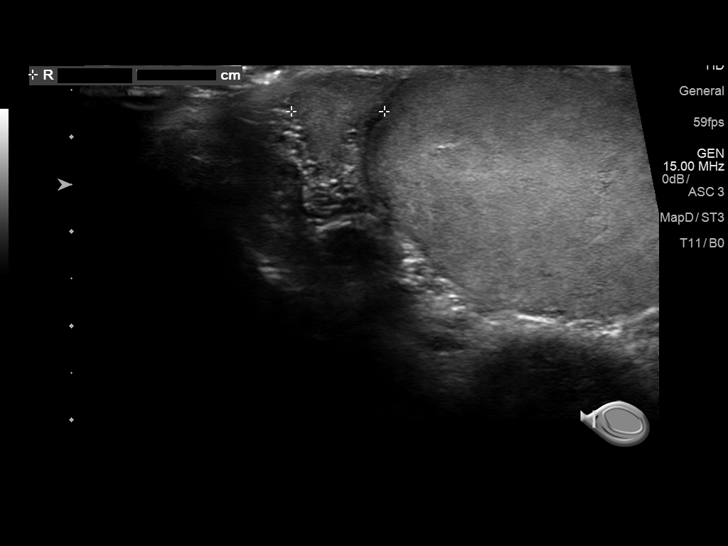
[im 36/44]
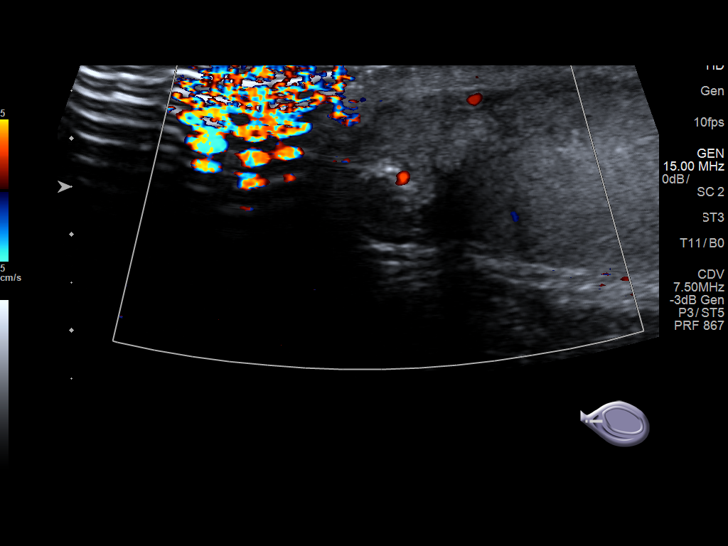
[im 40/44]
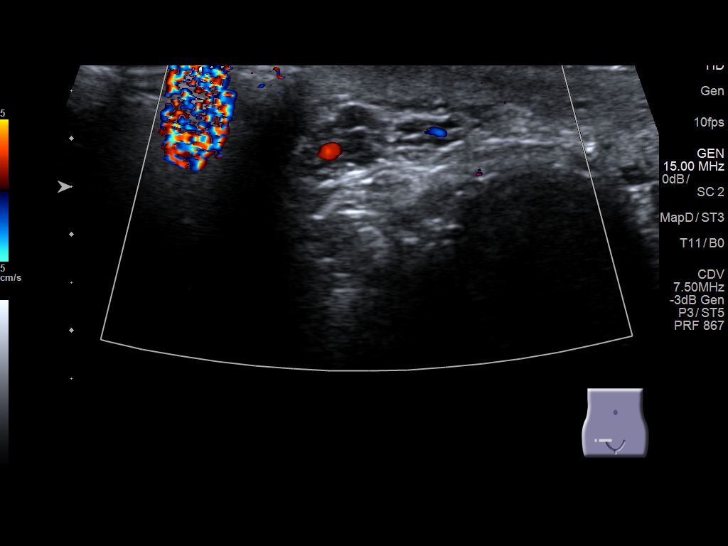
[im 44/44]
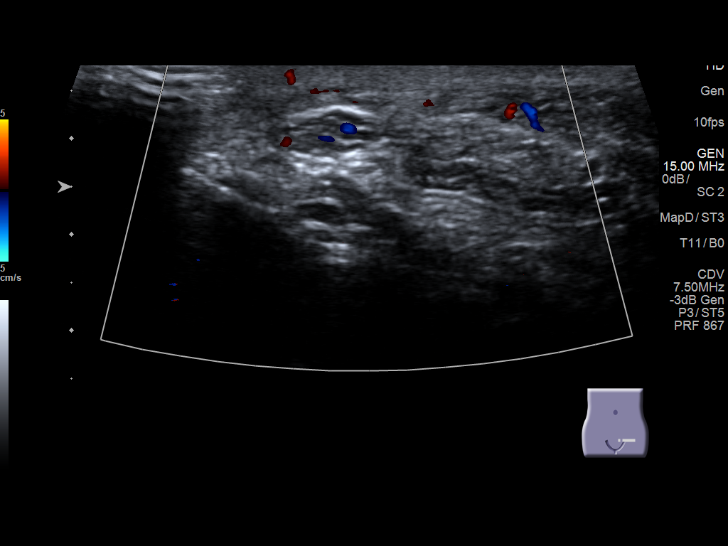

[14 of 25 positions shown; findings below may reference images not displayed]

FINDINGS: Right testicle

Measurements: 4.4 x 2.4 x 3.0 cm. No mass or microlithiasis
visualized.

Left testicle

Measurements: 4.5 x 2.2 x 2.9 cm. No mass or microlithiasis
visualized.

Right epididymis:  Normal in size and appearance.

Left epididymis:  Normal in size and appearance.

Hydrocele:  Small right hydrocele.

Varicocele:  None visualized.

Pulsed Doppler interrogation of both testes demonstrates normal low
resistance arterial and venous waveforms bilaterally.
IMPRESSION: Small right hydroceles otherwise unremarkable testicular ultrasound.
